# Patient Record
Sex: Female | Born: 2005 | Race: White | Hispanic: No | Marital: Single | Smoking: Never smoker
Health system: Southern US, Community
[De-identification: ages and names within clinical notes are randomized; demographics above are authoritative.]

## PROBLEM LIST (undated history)

## (undated) DIAGNOSIS — J309 Allergic rhinitis, unspecified: Secondary | ICD-10-CM

## (undated) DIAGNOSIS — J45909 Unspecified asthma, uncomplicated: Secondary | ICD-10-CM

---
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
## 2023-08-05 ENCOUNTER — Ambulatory Visit (INDEPENDENT_AMBULATORY_CARE_PROVIDER_SITE_OTHER): Payer: Self-pay | Admitting: Oncology

## 2023-08-05 ENCOUNTER — Other Ambulatory Visit: Payer: Self-pay

## 2023-08-05 VITALS — BP 118/69 | HR 82 | Temp 98.7°F | Ht 62.0 in | Wt 115.0 lb

## 2023-08-05 DIAGNOSIS — R3915 Urgency of urination: Secondary | ICD-10-CM

## 2023-08-05 LAB — POCT URINALYSIS DIPSTICK (MANUAL)
Nitrite, UA: NEGATIVE
Poct Bilirubin: NEGATIVE
Poct Glucose: NORMAL mg/dL
Poct Ketones: NEGATIVE
Poct Protein: NEGATIVE mg/dL
Poct Urobilinogen: NORMAL mg/dL
Spec Grav, UA: 1.02 (ref 1.010–1.025)
pH, UA: 6 (ref 5.0–8.0)

## 2023-08-05 MED ORDER — NITROFURANTOIN MONOHYD MACRO 100 MG PO CAPS: 100.0000 mg | ORAL_CAPSULE | Freq: Two times a day (BID) | ORAL | 0 refills | Status: DC

## 2023-08-05 NOTE — Progress Notes (Signed)
 [facility]  [address] [place], [place] [address] Phone: [phone] Fax: [phone]   Office Visit Note  Patient Name: Kristina Schaefer  Date of Apmuy:[id number]  Med Rec number [id number]  Date of Service: 08/05/2023  Amoxicillin  Chief Complaint  Patient presents with   Urinary Tract Infection    Patient c/o urinary urgency, cloudy urine, and fatigue.Symptoms began about 3 days ago. She has not taken any OTC medications.   HPI Patient is an 18 y.o. her for possible UTI. Noticed she has been urinating more often and feels like she has to pee again. No dysuria. No hematuria. Cloudy appearance. Hx of UTI in the past. Symptoms feel similar to previous UTI's. Last was in the summer. No recent antibiotics. NKDA.   Current Medication:  Outpatient Encounter Medications as of 08/05/2023  Medication Sig   albuterol  (VENTOLIN  HFA) 108 (90 Base) MCG/ACT inhaler TAKE 2 PUFFS BY MOUTH EVERY 6 HOURS AS NEEDED FOR WHEEZE OR SHORTNESS OF BREATH   loratadine (CLARITIN) 10 MG tablet Take 10 mg by mouth daily.   nitrofurantoin, macrocrystal-monohydrate, (MACROBID) 100 MG capsule Take 1 capsule (100 mg total) by mouth 2 (two) times daily.   cetirizine (ZYRTEC) 10 MG tablet Take 10 mg by mouth daily. Alternates with loratadine (Patient not taking: Reported on 08/05/2023)   norethindrone-ethinyl estradiol-FE (LOESTRIN FE) 1-20 MG-MCG tablet Take 1 tablet by mouth daily.   predniSONE  (DELTASONE ) 10 MG tablet 3 tablets daily x 5 days, then 2 tablets daily x 5 days, then 1 tablet daily x 4 days. (Patient not taking: Reported on 12/21/2021)   No facility-administered encounter medications on file as of 08/05/2023.     Medical History: Past Medical History:  Diagnosis Date   Allergic rhinitis    Asthma     Vital Signs: BP 118/69   Pulse 82   Temp 98.7 F (37.1 C)   Ht 5' 2 (1.575 m)   Wt 115 lb (52.2 kg)   SpO2 98%   BMI 21.03 kg/m   ROS: As per HPI.  All other pertinent ROS  negative.     Review of Systems  Genitourinary:  Positive for decreased urine volume, frequency and urgency. Negative for dysuria, flank pain and hematuria.    Physical Exam Vitals reviewed.  Constitutional:      Appearance: Normal appearance.  Abdominal:     General: Abdomen is flat.     Tenderness: There is no abdominal tenderness. There is no right CVA tenderness or left CVA tenderness.   Neurological:     Mental Status: She is alert.     Results for orders placed or performed in visit on 08/05/23 (from the past 24 hours)  POCT Urinalysis Dip Manual     Status: Abnormal   Collection Time: 08/05/23  2:35 PM  Result Value Ref Range   Spec Grav, UA 1.020 1.010 - 1.025   pH, UA 6.0 5.0 - 8.0   Leukocytes, UA Trace (A) Negative   Nitrite, UA Negative Negative   Poct Protein Negative Negative, trace mg/dL   Poct Glucose Normal Normal mg/dL   Poct Ketones Negative Negative   Poct Urobilinogen Normal Normal mg/dL   Poct Bilirubin Negative Negative   Poct Blood trace Negative, trace    Assessment/Plan: 1. Urinary urgency (Primary) UA not consistent with UTI but given symptoms and history would recommend urine cultures.  Take Nitrofurantoin every 12 hours (twice a day) with food x 7d; Finish all antibiotics. Drink plenty of  water. Avoid or limit alcohol and caffeine, which may make symptoms worse. You may take an over-the-counter pain reliever (i.e., AZO urinary pain relief, Tylenol) as needed for pain next 1-2 days. Send secure message to provider or schedule return appointment as needed for new/worsening symptoms (such as fever or abdominal pain) if your symptoms are not improving after 2-3 days taking antibiotics or if your symptoms do not completely resolve following antibiotics.   - POCT Urinalysis Dip Manual - nitrofurantoin, macrocrystal-monohydrate, (MACROBID) 100 MG capsule; Take 1 capsule (100 mg total) by mouth 2 (two) times daily.  Dispense: 14 capsule; Refill: 0 -  Urine Culture; Future   General Counseling: [name] verbalizes understanding of the findings of todays visit and agrees with plan of treatment. I have discussed any further diagnostic evaluation that may be needed or ordered today. We also reviewed her medications today. she has been encouraged to call the office with any questions or concerns that should arise related to todays visit.   Orders Placed This Encounter  Procedures   Urine Culture   POCT Urinalysis Dip Manual    Meds ordered this encounter  Medications   nitrofurantoin, macrocrystal-monohydrate, (MACROBID) 100 MG capsule    Sig: Take 1 capsule (100 mg total) by mouth 2 (two) times daily.    Dispense:  14 capsule    Refill:  0   I spent 20 minutes dedicated to the care of this patient (face-to-face and non-face-to-face) on the date of the encounter to include what is described in the assessment and plan.   Delon Hope, NP 08/05/2023 3:04 PM

## 2023-08-07 ENCOUNTER — Ambulatory Visit: Payer: Self-pay | Admitting: Oncology

## 2023-08-07 LAB — URINE CULTURE

## 2023-08-13 ENCOUNTER — Encounter: Payer: Self-pay | Admitting: Medical

## 2023-08-13 ENCOUNTER — Ambulatory Visit (INDEPENDENT_AMBULATORY_CARE_PROVIDER_SITE_OTHER): Payer: Self-pay | Admitting: Medical

## 2023-08-13 VITALS — BP 137/77 | HR 75 | Temp 99.0°F | Ht 62.0 in

## 2023-08-13 DIAGNOSIS — R1032 Left lower quadrant pain: Secondary | ICD-10-CM

## 2023-08-13 DIAGNOSIS — R350 Frequency of micturition: Secondary | ICD-10-CM

## 2023-08-13 LAB — POCT URINALYSIS DIPSTICK (MANUAL)
Nitrite, UA: NEGATIVE
Poct Bilirubin: NEGATIVE
Poct Glucose: NORMAL mg/dL
Poct Ketones: NEGATIVE
Poct Protein: NEGATIVE mg/dL
Poct Urobilinogen: NORMAL mg/dL
Spec Grav, UA: 1.01 (ref 1.010–1.025)
pH, UA: 7.5 (ref 5.0–8.0)

## 2023-08-13 NOTE — Progress Notes (Signed)
 Therapist, [facility] [address] [place], [place] [address]   Office Visit Note  Patient Name: Kristina Schaefer Date of Birth [birth year]  Medical Record number [id number]  Date of Service: 08/13/2023  Chief Complaint  Patient presents with   Follow-up    Patient reports having LLQ abdominal pain yesterday afternoon and frequent urination. She completed abx yesterday.     HPI 18 y.o. female presents with lower abdominal pain.  Seen 08/05/23 reporting urinary urgency and cloudy urine. UA and culture with minimal findings. Culture was not consistent with UTI. Was given 7-day course of Macrobid , finished this yesterday.  Does feel sx improved since taking abx. Noted left lower abdominal pain over 4 hour period yesterday. Thinks could be ovulation pain which does happen from time to time. No pain today. No fever, chills or back pain. Denies vaginal discharge, itching or discomfort.  She is sexually active with boyfriend, reports using condoms consistently. Does not want STI testing today. Reports taking OCP consistently.  Patient states she will be going away this weekend for holiday, mom wanted her to come in to make sure abdominal pain wasn't anything to worry about.  Current Medication:  Outpatient Encounter Medications as of 08/13/2023  Medication Sig   albuterol  (VENTOLIN  HFA) 108 (90 Base) MCG/ACT inhaler TAKE 2 PUFFS BY MOUTH EVERY 6 HOURS AS NEEDED FOR WHEEZE OR SHORTNESS OF BREATH   loratadine (CLARITIN) 10 MG tablet Take 10 mg by mouth daily.   norethindrone-ethinyl estradiol-FE (LOESTRIN FE) 1-20 MG-MCG tablet Take 1 tablet by mouth daily.   cetirizine (ZYRTEC) 10 MG tablet Take 10 mg by mouth daily. Alternates with loratadine (Patient not taking: Reported on 08/13/2023)   [DISCONTINUED] nitrofurantoin , macrocrystal-monohydrate, (MACROBID ) 100 MG capsule Take 1 capsule (100 mg total) by mouth 2 (two) times daily.   [DISCONTINUED] predniSONE  (DELTASONE ) 10 MG tablet 3 tablets daily x  5 days, then 2 tablets daily x 5 days, then 1 tablet daily x 4 days. (Patient not taking: Reported on 12/21/2021)   No facility-administered encounter medications on file as of 08/13/2023.      Medical History: Past Medical History:  Diagnosis Date   Allergic rhinitis    Asthma      Vital Signs: BP 137/77   Pulse 75   Temp 99 F (37.2 C)   Ht 5' 2 (1.575 m)   SpO2 99%   BMI 21.03 kg/m    Review of Systems  Constitutional:  Negative for chills and fever.  Gastrointestinal:  Negative for nausea and vomiting.  Genitourinary:  Positive for pelvic pain (yesterday, now resolved). Negative for dysuria, flank pain, frequency, genital sores, hematuria, urgency, vaginal discharge and vaginal pain.    Physical Exam Vitals reviewed.  Constitutional:      General: She is not in acute distress.    Appearance: She is not ill-appearing.  Cardiovascular:     Rate and Rhythm: Normal rate and regular rhythm.     Heart sounds: No murmur heard.    No friction rub. No gallop.  Pulmonary:     Effort: Pulmonary effort is normal.     Breath sounds: Normal breath sounds. No wheezing, rhonchi or rales.  Abdominal:     General: Bowel sounds are normal.     Palpations: Abdomen is soft. There is no mass.     Tenderness: There is no abdominal tenderness. There is no right CVA tenderness, left CVA tenderness or guarding.  Neurological:     Mental Status: She is alert.  POCT Urinalysis Dip Manual     Status: Abnormal   Collection Time: 08/05/23  2:35 PM  Result Value Ref Range   Spec Grav, UA 1.020 1.010 - 1.025   pH, UA 6.0 5.0 - 8.0   Leukocytes, UA Trace (A) Negative   Nitrite, UA Negative Negative   Poct Protein Negative Negative, trace mg/dL   Poct Glucose Normal Normal mg/dL   Poct Ketones Negative Negative   Poct Urobilinogen Normal Normal mg/dL   Poct Bilirubin Negative Negative   Poct Blood trace Negative, trace  Urine Culture     Status: None   Collection Time: 08/05/23   3:36 PM   Specimen: Urine   Urine  Result Value Ref Range   Urine Culture, Routine Final report    Organism ID, Bacteria Comment     Comment: Culture shows less than 10,000 colony forming units of bacteria per milliliter of urine. This colony count is not generally considered to be clinically significant.   POCT Urinalysis Dip Manual     Status: Abnormal   Collection Time: 08/13/23 11:16 AM  Result Value Ref Range   Spec Grav, UA 1.010 1.010 - 1.025   pH, UA 7.5 5.0 - 8.0   Leukocytes, UA Trace (A) Negative   Nitrite, UA Negative Negative   Poct Protein Negative Negative, trace mg/dL   Poct Glucose Normal Normal mg/dL   Poct Ketones Negative Negative   Poct Urobilinogen Normal Normal mg/dL   Poct Bilirubin Negative Negative   Poct Blood trace Negative, trace     Assessment/Plan: 1. Left lower quadrant abdominal pain (Primary) Pain currently resolved, patient well-appearing. Patient reassured that sx do not appear suggestive of UTI/kidney infection. Suspect mittelschmerz/ovulation pain. UA with trace of leukocytes and trace of blood, similar to UA done 6/24. Urine cx done on 6/24 was not consistent with UTI. Patient not concerned for STI today, declines testing. Patient encouraged to hydrate well. May use heating pad or take OTC pain reliever if needed should pain reoccur. Contact provider (call or send message), schedule follow up visit or visit urgent/primary care provider as needed with new/worsening/recurring symptoms.  - POCT Urinalysis Dip Manual  Patient Instructions  -Drink plenty of water. Avoid or limit caffeine. -If the pain returns, you may use a heating pad for 10-15 minutes on low-medium setting. You may also take over-the-counter Tylenol or Ibuprofen if needed for pain.  -Call or send message to provider, schedule return appointment or visit urgent/primary care provider as needed for new/worsening/reoccurring symptoms (such as fever, pain with urination, abdominal  pain).      General Counseling: Lysandra verbalizes understanding of the findings of todays visit. she has been encouraged to call the office with any questions or concerns that should arise related to todays visit.   Time spent:20 Minutes   Joen Arts PA-C Physician Assistant

## 2023-08-19 ENCOUNTER — Ambulatory Visit (INDEPENDENT_AMBULATORY_CARE_PROVIDER_SITE_OTHER): Payer: Self-pay | Admitting: Physician Assistant

## 2023-08-19 ENCOUNTER — Encounter: Payer: Self-pay | Admitting: Physician Assistant

## 2023-08-19 VITALS — HR 82 | Temp 98.4°F

## 2023-08-19 DIAGNOSIS — R350 Frequency of micturition: Secondary | ICD-10-CM

## 2023-08-19 LAB — POCT URINALYSIS DIPSTICK (MANUAL)
Nitrite, UA: NEGATIVE
Poct Bilirubin: NEGATIVE
Poct Blood: 50 — AB
Poct Glucose: NORMAL mg/dL
Poct Ketones: NEGATIVE
Poct Protein: NEGATIVE mg/dL
Poct Urobilinogen: NORMAL mg/dL
Spec Grav, UA: <=1.005 — AB (ref 1.010–1.025)
pH, UA: 6.5 (ref 5.0–8.0)

## 2023-08-19 MED ORDER — SULFAMETHOXAZOLE-TRIMETHOPRIM 800-160 MG PO TABS: 1.0000 | ORAL_TABLET | Freq: Two times a day (BID) | ORAL | 0 refills | Status: AC

## 2023-08-19 NOTE — Progress Notes (Signed)
 [facility]rap[facility], [facility] [address] [place], [place] [address]   Office Visit Note  Patient Name: Kristina Schaefer Date of Birth [birth year]  Medical Record number [id number]  Date of Service: 08/19/2023  Chief Complaint  Patient presents with   Follow-up    Patient c/o frequent urination with burning. She also reports having chills. Symptoms began this morning.     HPI  Patient presents for a sick visit with recurrent urinary symptoms, including burning with urination, increased urinary frequency, and suprapubic discomfort. This is her third visit for similar complaints. At the initial visit, she was treated with Macrobid , though the urine culture ultimately returned negative; symptoms improved at that time. At the second visit, no treatment was given as symptoms had resolved. She reports that symptoms have now returned. She is about to start her period and spent the weekend in [place] hiking and swimming. She is sexually active with her boyfriend and reports condom use, though one condom partially came off during intercourse over the weekend. She also mentions that some others from the trip have not been feeling well. She would like STI testing today.   Current Medication:  Outpatient Encounter Medications as of 08/19/2023  Medication Sig   albuterol  (VENTOLIN  HFA) 108 (90 Base) MCG/ACT inhaler TAKE 2 PUFFS BY MOUTH EVERY 6 HOURS AS NEEDED FOR WHEEZE OR SHORTNESS OF BREATH   Albuterol -Budesonide (AIRSUPRA) 90-80 MCG/ACT AERO INHALE 2 PUFFS BY MOUTH EVERY TWELVE HOURS (Patient taking differently: 2 puffs 2 (two) times daily as needed.)   loratadine (CLARITIN) 10 MG tablet Take 10 mg by mouth daily.   norethindrone-ethinyl estradiol-FE (LOESTRIN FE) 1-20 MG-MCG tablet Take 1 tablet by mouth daily.   cetirizine (ZYRTEC) 10 MG tablet Take 10 mg by mouth daily. Alternates with loratadine (Patient not taking: Reported on 08/19/2023)   No facility-administered encounter medications on file as of  08/19/2023.      Medical History: Past Medical History:  Diagnosis Date   Allergic rhinitis    Asthma      Vital Signs: Pulse 82   Temp 98.4 F (36.9 C)   SpO2 99%    Review of Systems  Physical Exam Constitutional:      Appearance: Normal appearance.  HENT:     Head: Normocephalic and atraumatic.  Abdominal:     Tenderness: There is abdominal tenderness.  Neurological:     Mental Status: She is alert.       Assessment/Plan: Patient is seen today for a third visit with recurrent urinary symptoms. Urine dip is more concerning than during prior visits, showing moderate leukocytes and blood. She denies fever or CVA tenderness. Given her ongoing symptoms, we will send another urine culture and STI testing today. Bactrim  sent, she can start if symptoms persist/worsen. Conservative strategies were reviewed, including increased hydration, use of Azo for symptom relief, and cranberry juice. She was counseled on precautions and advised to return if symptoms worsen or do not improve with treatment.  General Counseling: Kristina Schaefer verbalizes understanding of the findings of todays visit and agrees with plan of treatment. I have discussed any further diagnostic evaluation that may be needed or ordered today. We also reviewed her medications today. she has been encouraged to call the office with any questions or concerns that should arise related to todays visit.   Orders Placed This Encounter  Procedures   POCT Urinalysis Dip Manual    No orders of the defined types were placed in this encounter.   Time spent:20 Minutes  Odis Potash, PA-C Physician Assistant

## 2023-08-21 ENCOUNTER — Ambulatory Visit: Payer: Self-pay | Admitting: Physician Assistant

## 2023-08-21 LAB — CHLAMYDIA/GONOCOCCUS/TRICHOMONAS, NAA
Chlamydia by NAA: NEGATIVE
Gonococcus by NAA: NEGATIVE
Trich vag by NAA: NEGATIVE

## 2023-08-22 LAB — URINE CULTURE

## 2023-08-22 NOTE — Patient Instructions (Addendum)
-  Drink plenty of water. Avoid or limit caffeine. -If the pain returns, you may use a heating pad for 10-15 minutes on low-medium setting. You may also take over-the-counter Tylenol or Ibuprofen if needed for pain.  -Call or send message to provider, schedule return appointment or visit urgent/primary care provider as needed for new/worsening/reoccurring symptoms (such as fever, pain with urination, abdominal pain).

## 2023-08-26 MED ORDER — NITROFURANTOIN MONOHYD MACRO 100 MG PO CAPS: 100.0000 mg | ORAL_CAPSULE | Freq: Two times a day (BID) | ORAL | 0 refills | Status: AC

## 2023-08-26 NOTE — Addendum Note (Signed)
 Addended by: Keonna Raether on: 08/26/2023 08:31 AM   Modules accepted: Orders

## 2023-09-22 ENCOUNTER — Other Ambulatory Visit

## 2023-09-22 DIAGNOSIS — Z13220 Encounter for screening for lipoid disorders: Secondary | ICD-10-CM

## 2023-09-22 DIAGNOSIS — Z13228 Encounter for screening for other metabolic disorders: Secondary | ICD-10-CM

## 2023-09-24 LAB — COMPREHENSIVE METABOLIC PANEL WITH GFR
ALT: 24 IU/L (ref 0–32)
AST: 25 IU/L (ref 0–40)
Albumin: 4.5 g/dL (ref 4.0–5.0)
Alkaline Phosphatase: 93 IU/L (ref 42–106)
BUN/Creatinine Ratio: 16 (ref 9–23)
BUN: 13 mg/dL (ref 6–20)
Bilirubin Total: 0.5 mg/dL (ref 0.0–1.2)
CO2: 23 mmol/L (ref 20–29)
Calcium: 9.4 mg/dL (ref 8.7–10.2)
Chloride: 106 mmol/L (ref 96–106)
Creatinine, Ser: 0.8 mg/dL (ref 0.57–1.00)
Globulin, Total: 2.4 g/dL (ref 1.5–4.5)
Glucose: 88 mg/dL (ref 70–99)
Potassium: 4.2 mmol/L (ref 3.5–5.2)
Sodium: 138 mmol/L (ref 134–144)
Total Protein: 6.9 g/dL (ref 6.0–8.5)
eGFR: 108 mL/min/1.73 (ref >59)

## 2023-09-24 LAB — CBC WITH DIFFERENTIAL/PLATELET
Basophils Absolute: 0 x10E3/uL (ref 0.0–0.2)
Basos: 1 %
EOS (ABSOLUTE): 0.1 x10E3/uL (ref 0.0–0.4)
Eos: 3 %
Hematocrit: 42.4 % (ref 34.0–46.6)
Hemoglobin: 14.1 g/dL (ref 11.1–15.9)
Immature Grans (Abs): 0 x10E3/uL (ref 0.0–0.1)
Immature Granulocytes: 0 %
Lymphocytes Absolute: 1.7 x10E3/uL (ref 0.7–3.1)
Lymphs: 37 %
MCH: 30.4 pg (ref 26.6–33.0)
MCHC: 33.3 g/dL (ref 31.5–35.7)
MCV: 91 fL (ref 79–97)
Monocytes Absolute: 0.4 x10E3/uL (ref 0.1–0.9)
Monocytes: 8 %
Neutrophils Absolute: 2.5 x10E3/uL (ref 1.4–7.0)
Neutrophils: 51 %
Platelets: 260 x10E3/uL (ref 150–450)
RBC: 4.64 x10E6/uL (ref 3.77–5.28)
RDW: 11.7 % (ref 11.7–15.4)
WBC: 4.7 x10E3/uL (ref 3.4–10.8)

## 2023-09-24 LAB — LIPID PANEL WITH LDL/HDL RATIO
Cholesterol, Total: 153 mg/dL (ref 100–199)
HDL: 45 mg/dL (ref >39)
LDL Chol Calc (NIH): 94 mg/dL (ref 0–99)
LDL/HDL Ratio: 2.1 ratio (ref 0.0–3.2)
Triglycerides: 73 mg/dL (ref 0–149)
VLDL Cholesterol Cal: 14 mg/dL (ref 5–40)

## 2023-09-24 LAB — INSULIN, RANDOM: INSULIN: 8.1 u[IU]/mL (ref 2.6–24.9)

## 2023-09-24 LAB — TSH+FREE T4
Free T4: 1.5 ng/dL (ref 0.82–1.77)
TSH: 1.42 u[IU]/mL (ref 0.450–4.500)

## 2023-09-24 LAB — HGB A1C W/O EAG: Hgb A1c MFr Bld: 4.9 % (ref 4.8–5.6)

## 2023-09-24 LAB — VITAMIN D 25 HYDROXY (VIT D DEFICIENCY, FRACTURES): Vit D, 25-Hydroxy: 33.7 ng/mL (ref 30.0–100.0)
